# Patient Record
Sex: Female | Born: 1975 | Race: White | Hispanic: No | Marital: Married | Smoking: Never smoker
Health system: Southern US, Community
[De-identification: ages and names within clinical notes are randomized; demographics above are authoritative.]

## PROBLEM LIST (undated history)

## (undated) DIAGNOSIS — Z8619 Personal history of other infectious and parasitic diseases: Secondary | ICD-10-CM

## (undated) HISTORY — DX: Personal history of other infectious and parasitic diseases: Z86.19

## (undated) HISTORY — PX: WISDOM TOOTH EXTRACTION: SHX21

---

## 2008-12-26 ENCOUNTER — Inpatient Hospital Stay (HOSPITAL_COMMUNITY): Admission: AD | Admit: 2008-12-26 | Discharge: 2008-12-29 | Payer: Self-pay | Admitting: Obstetrics and Gynecology

## 2009-02-07 ENCOUNTER — Ambulatory Visit: Admission: RE | Admit: 2009-02-07 | Discharge: 2009-02-07 | Payer: Self-pay | Admitting: Obstetrics and Gynecology

## 2010-10-28 LAB — CBC
HCT: 33.9 % — ABNORMAL LOW (ref 36.0–46.0)
Hemoglobin: 11.9 g/dL — ABNORMAL LOW (ref 12.0–15.0)
MCHC: 35.2 g/dL (ref 30.0–36.0)
MCHC: 35.8 g/dL (ref 30.0–36.0)
MCV: 100.2 fL — ABNORMAL HIGH (ref 78.0–100.0)
Platelets: 121 10*3/uL — ABNORMAL LOW (ref 150–400)
RBC: 2.76 MIL/uL — ABNORMAL LOW (ref 3.87–5.11)
RDW: 13.7 % (ref 11.5–15.5)

## 2011-02-03 LAB — HEPATITIS B SURFACE ANTIGEN: Hepatitis B Surface Ag: NEGATIVE

## 2011-02-03 LAB — RUBELLA ANTIBODY, IGM: Rubella: IMMUNE

## 2011-02-03 LAB — GC/CHLAMYDIA PROBE AMP, GENITAL: Chlamydia: NEGATIVE

## 2011-07-22 NOTE — L&D Delivery Note (Signed)
Delivery Note At 1:01 AM a viable female was delivered via Vaginal, Spontaneous Delivery (Presentation: ; Occiput Anterior).  APGAR: 9, 9; weight 7 lb 4 oz (3289 g).   Placenta status: Intact, Spontaneous.  Cord: 3 vessels with the following complications: None.  Cord pH: not sent  Anesthesia: Local  Episiotomy: None Lacerations: 2nd degree Suture Repair: 3.0 chromic vicryl rapide Est. Blood Loss (mL): 400 Rapid del by midwife, plac spont intact, received IV Stadol 1 mg and local>>bilat vag lac + second degree perineal lac repaired   Mom to postpartum.  Baby to nursery-stable.  Meriel Pica 09/12/2011, 1:49 AM

## 2011-08-11 LAB — STREP B DNA PROBE: GBS: NEGATIVE

## 2011-09-01 ENCOUNTER — Telehealth (HOSPITAL_COMMUNITY): Payer: Self-pay | Admitting: *Deleted

## 2011-09-01 ENCOUNTER — Encounter (HOSPITAL_COMMUNITY): Payer: Self-pay | Admitting: *Deleted

## 2011-09-01 NOTE — Telephone Encounter (Signed)
Preadmission screen  

## 2011-09-03 ENCOUNTER — Encounter (HOSPITAL_COMMUNITY): Payer: Self-pay | Admitting: *Deleted

## 2011-09-03 ENCOUNTER — Telehealth (HOSPITAL_COMMUNITY): Payer: Self-pay | Admitting: *Deleted

## 2011-09-03 NOTE — Telephone Encounter (Signed)
Preadmission screen  

## 2011-09-11 ENCOUNTER — Inpatient Hospital Stay (HOSPITAL_COMMUNITY)
Admission: AD | Admit: 2011-09-11 | Discharge: 2011-09-14 | DRG: 373 | Disposition: A | Discharge status: Home / Self Care | Payer: BC Managed Care – PPO | Source: Ambulatory Visit | Attending: Obstetrics and Gynecology | Admitting: Obstetrics and Gynecology

## 2011-09-12 ENCOUNTER — Encounter (HOSPITAL_COMMUNITY): Payer: Self-pay | Admitting: *Deleted

## 2011-09-12 LAB — CBC
HCT: 34.4 % — ABNORMAL LOW (ref 36.0–46.0)
HCT: 31.7 % — ABNORMAL LOW (ref 36.0–46.0)
Hemoglobin: 11.9 g/dL — ABNORMAL LOW (ref 12.0–15.0)
Hemoglobin: 10.8 g/dL — ABNORMAL LOW (ref 12.0–15.0)
MCH: 32.4 pg (ref 26.0–34.0)
MCH: 32.1 pg (ref 26.0–34.0)
MCHC: 34.1 g/dL (ref 30.0–36.0)
MCV: 93.7 fL (ref 78.0–100.0)
Platelets: 171 10*3/uL (ref 150–400)
RBC: 3.67 MIL/uL — ABNORMAL LOW (ref 3.87–5.11)
WBC: 10.4 10*3/uL (ref 4.0–10.5)

## 2011-09-12 MED ORDER — OXYCODONE-ACETAMINOPHEN 5-325 MG PO TABS
1.0000 | ORAL_TABLET | Freq: Four times a day (QID) | ORAL | Status: DC | PRN
  Administered 2011-09-12 – 2011-09-13 (×2): 1 via ORAL
  Filled 2011-09-12 (×2): qty 1

## 2011-09-12 MED ORDER — SENNOSIDES-DOCUSATE SODIUM 8.6-50 MG PO TABS
2.0000 | ORAL_TABLET | Freq: Every day | ORAL | Status: DC
  Administered 2011-09-12 – 2011-09-13 (×3): 2 via ORAL

## 2011-09-12 MED ORDER — LANOLIN HYDROUS EX OINT: TOPICAL_OINTMENT | CUTANEOUS | Status: DC | PRN

## 2011-09-12 MED ORDER — OXYTOCIN 20 UNITS IN LACTATED RINGERS INFUSION - SIMPLE
INTRAVENOUS | Status: AC
  Administered 2011-09-12: 125 mL/h via INTRAVENOUS
  Filled 2011-09-12: qty 1000

## 2011-09-12 MED ORDER — FLEET ENEMA 7-19 GM/118ML RE ENEM: 1.0000 | ENEMA | Freq: Every day | RECTAL | Status: DC | PRN

## 2011-09-12 MED ORDER — BUTORPHANOL TARTRATE 2 MG/ML IJ SOLN
1.0000 mg | Freq: Once | INTRAMUSCULAR | Status: AC
  Administered 2011-09-12: 1 mg via INTRAVENOUS
  Filled 2011-09-12 (×2): qty 1

## 2011-09-12 MED ORDER — LIDOCAINE HCL (PF) 1 % IJ SOLN
INTRAMUSCULAR | Status: AC
  Administered 2011-09-12: 30 mL via SUBCUTANEOUS
  Filled 2011-09-12: qty 30

## 2011-09-12 MED ORDER — IBUPROFEN 800 MG PO TABS
800.0000 mg | ORAL_TABLET | Freq: Three times a day (TID) | ORAL | Status: DC | PRN
  Administered 2011-09-12 – 2011-09-14 (×6): 800 mg via ORAL
  Filled 2011-09-12 (×8): qty 1

## 2011-09-12 MED ORDER — OXYTOCIN BOLUS FROM INFUSION
500.0000 mL | Freq: Once | INTRAVENOUS | Status: DC
  Filled 2011-09-12: qty 500

## 2011-09-12 MED ORDER — IBUPROFEN 600 MG PO TABS
600.0000 mg | ORAL_TABLET | Freq: Four times a day (QID) | ORAL | Status: DC | PRN
  Administered 2011-09-12: 600 mg via ORAL
  Filled 2011-09-12: qty 1

## 2011-09-12 MED ORDER — LACTATED RINGERS IV SOLN: INTRAVENOUS | Status: DC

## 2011-09-12 MED ORDER — WITCH HAZEL-GLYCERIN EX PADS: 1.0000 "application " | MEDICATED_PAD | CUTANEOUS | Status: DC | PRN

## 2011-09-12 MED ORDER — BENZOCAINE-MENTHOL 20-0.5 % EX AERO
INHALATION_SPRAY | CUTANEOUS | Status: AC
  Filled 2011-09-12: qty 56

## 2011-09-12 MED ORDER — BISACODYL 10 MG RE SUPP: 10.0000 mg | Freq: Every day | RECTAL | Status: DC | PRN

## 2011-09-12 MED ORDER — BENZOCAINE-MENTHOL 20-0.5 % EX AERO
1.0000 "application " | INHALATION_SPRAY | CUTANEOUS | Status: DC | PRN
  Administered 2011-09-12: 1 via TOPICAL

## 2011-09-12 MED ORDER — CITRIC ACID-SODIUM CITRATE 334-500 MG/5ML PO SOLN: 30.0000 mL | ORAL | Status: DC | PRN

## 2011-09-12 MED ORDER — OXYTOCIN 20 UNITS IN LACTATED RINGERS INFUSION - SIMPLE
125.0000 mL/h | Freq: Once | INTRAVENOUS | Status: AC
  Administered 2011-09-12: 125 mL/h via INTRAVENOUS

## 2011-09-12 MED ORDER — ONDANSETRON HCL 4 MG/2ML IJ SOLN: 4.0000 mg | INTRAMUSCULAR | Status: DC | PRN

## 2011-09-12 MED ORDER — PRENATAL MULTIVITAMIN CH
1.0000 | ORAL_TABLET | Freq: Every day | ORAL | Status: DC
  Administered 2011-09-12 – 2011-09-13 (×2): 1 via ORAL
  Filled 2011-09-12 (×3): qty 1

## 2011-09-12 MED ORDER — FLEET ENEMA 7-19 GM/118ML RE ENEM: 1.0000 | ENEMA | RECTAL | Status: DC | PRN

## 2011-09-12 MED ORDER — ZOLPIDEM TARTRATE 5 MG PO TABS: 5.0000 mg | ORAL_TABLET | Freq: Every evening | ORAL | Status: DC | PRN

## 2011-09-12 MED ORDER — TETANUS-DIPHTH-ACELL PERTUSSIS 5-2.5-18.5 LF-MCG/0.5 IM SUSP: 0.5000 mL | Freq: Once | INTRAMUSCULAR | Status: DC

## 2011-09-12 MED ORDER — OXYCODONE-ACETAMINOPHEN 5-325 MG PO TABS
1.0000 | ORAL_TABLET | ORAL | Status: DC | PRN
  Administered 2011-09-12: 1 via ORAL
  Filled 2011-09-12: qty 1

## 2011-09-12 MED ORDER — MEASLES, MUMPS & RUBELLA VAC ~~LOC~~ INJ
0.5000 mL | INJECTION | Freq: Once | SUBCUTANEOUS | Status: DC
  Filled 2011-09-12: qty 0.5

## 2011-09-12 MED ORDER — DIPHENHYDRAMINE HCL 25 MG PO CAPS: 25.0000 mg | ORAL_CAPSULE | Freq: Four times a day (QID) | ORAL | Status: DC | PRN

## 2011-09-12 MED ORDER — LIDOCAINE HCL (PF) 1 % IJ SOLN
30.0000 mL | INTRAMUSCULAR | Status: AC | PRN
  Administered 2011-09-12 (×2): 30 mL via SUBCUTANEOUS
  Filled 2011-09-12: qty 30

## 2011-09-12 MED ORDER — DIBUCAINE 1 % RE OINT: 1.0000 "application " | TOPICAL_OINTMENT | RECTAL | Status: DC | PRN

## 2011-09-12 MED ORDER — LACTATED RINGERS IV SOLN: 500.0000 mL | INTRAVENOUS | Status: DC | PRN

## 2011-09-12 MED ORDER — ACETAMINOPHEN 325 MG PO TABS: 650.0000 mg | ORAL_TABLET | ORAL | Status: DC | PRN

## 2011-09-12 MED ORDER — ONDANSETRON HCL 4 MG PO TABS: 4.0000 mg | ORAL_TABLET | ORAL | Status: DC | PRN

## 2011-09-12 MED ORDER — ONDANSETRON HCL 4 MG/2ML IJ SOLN: 4.0000 mg | Freq: Four times a day (QID) | INTRAMUSCULAR | Status: DC | PRN

## 2011-09-12 MED ORDER — SIMETHICONE 80 MG PO CHEW: 80.0000 mg | CHEWABLE_TABLET | ORAL | Status: DC | PRN

## 2011-09-12 NOTE — Progress Notes (Signed)
Post Partum Day 0 Subjective: no complaints, up ad lib and tolerating PO  Objective: Blood pressure 97/59, pulse 81, temperature 98.2 F (36.8 C), temperature source Oral, resp. rate 18, height 5\' 4"  (1.626 m), weight 76.204 kg (168 lb), last menstrual period 12/04/2010, SpO2 99.00%, unknown if currently breastfeeding.  Physical Exam:  General: alert and cooperative Lochia: appropriate Uterine Fundus: firm Perineum intact DVT Evaluation: No evidence of DVT seen on physical exam.   Basename 09/12/11 0505 09/12/11 0150  HGB 10.8* 11.9*  HCT 31.7* 34.4*    Assessment/Plan: Plan for discharge tomorrow   LOS: 1 day   Keonda Dow G 09/12/2011, 7:53 AM

## 2011-09-12 NOTE — H&P (Signed)
Shannon Harmon is a 36 y.o. female presenting for labor at term. Maternal Medical History:  Reason for admission: Reason for admission: contractions.  Contractions: Onset was 3-5 hours ago.   Frequency: regular.   Perceived severity is moderate.    Fetal activity: Perceived fetal activity is normal.   Last perceived fetal movement was within the past hour.      OB History    Grav Para Term Preterm Abortions TAB SAB Ect Mult Living   2 1 1       1      Past Medical History  Diagnosis Date  . History of chicken pox    Past Surgical History  Procedure Date  . Wisdom tooth extraction    Family History: family history includes Diabetes in her father, mother, and paternal grandfather; Heart disease in her maternal grandmother; Hypertension in her father and mother; and Stroke in her maternal grandfather. Social History:  reports that she has never smoked. She has never used smokeless tobacco. She reports that she does not drink alcohol or use illicit drugs.  ROS  Dilation: 7 Effacement (%): 100 Station: -1 Exam by:: M.Topp,RN Blood pressure 114/76, pulse 88, temperature 98.6 F (37 C), temperature source Oral, resp. rate 20, height 5\' 4"  (1.626 m), weight 168 lb (76.204 kg), last menstrual period 12/04/2010. Maternal Exam:  Uterine Assessment: Contraction strength is moderate.  Contraction frequency is regular.   Abdomen: Patient reports no abdominal tenderness. Fundal height is term.   Estimated fetal weight is AGA.   Fetal presentation: vertex     Physical Exam  Constitutional: She is oriented to person, place, and time. She appears well-developed and well-nourished.  HENT:  Head: Normocephalic and atraumatic.  Neck: Normal range of motion. Neck supple.  Cardiovascular: Normal rate and regular rhythm.   Respiratory: Effort normal and breath sounds normal.  GI:       Term FH, FHR 150's  Genitourinary:       In MAU>>cx 5 cm  Musculoskeletal: Normal range of motion.    Neurological: She is alert and oriented to person, place, and time.    Prenatal labs: ABO, Rh: A/Positive/-- (07/16 0000) Antibody: Negative (07/16 0000) Rubella: Immune (07/16 0000) RPR: Nonreactive (07/16 0000)  HBsAg: Negative (07/16 0000)  HIV: Non-reactive (07/16 0000)  GBS: Negative (01/21 0000)   Assessment/Plan: Term IUP, labor, requests epidural   Shannon Harmon M 09/12/2011, 1:47 AM

## 2011-09-12 NOTE — Progress Notes (Signed)
C.Hayes,RN called and status update given on the pt-informed pt was 7cm and getting "pushy" and requesting an epidural-sttes she will call us back

## 2011-09-13 NOTE — Progress Notes (Signed)
Post Partum Day 1 Subjective: no complaints, up ad lib, voiding and tolerating PO  Objective: Blood pressure 105/73, pulse 81, temperature 98.3 F (36.8 C), temperature source Oral, resp. rate 18, height 5\' 4"  (1.626 m), weight 76.204 kg (168 lb), last menstrual period 12/04/2010, SpO2 100.00%, unknown if currently breastfeeding.  Physical Exam:  General: alert, cooperative and appears stated age Lochia: appropriate Uterine Fundus: firm Incision: not applicable DVT Evaluation: No evidence of DVT seen on physical exam.   Basename 09/12/11 0505 09/12/11 0150  HGB 10.8* 11.9*  HCT 31.7* 34.4*    Assessment/Plan: Plan for discharge tomorrow   LOS: 2 days   Brittlyn Cloe L 09/13/2011, 7:50 AM

## 2011-09-14 ENCOUNTER — Inpatient Hospital Stay (HOSPITAL_COMMUNITY): Admission: RE | Admit: 2011-09-14 | Payer: BC Managed Care – PPO | Source: Ambulatory Visit

## 2011-09-14 NOTE — Discharge Summary (Signed)
Obstetric Discharge Summary Reason for Admission: onset of labor Prenatal Procedures: none Intrapartum Procedures: spontaneous vaginal delivery Postpartum Procedures: none Complications-Operative and Postpartum: 2nd degree perineal laceration Hemoglobin  Date Value Range Status  09/12/2011 10.8* 12.0-15.0 (g/dL) Final     HCT  Date Value Range Status  09/12/2011 31.7* 36.0-46.0 (%) Final    Discharge Diagnoses: Term Pregnancy-delivered  Discharge Information: Date: 09/14/2011 Activity: pelvic rest Diet: routine Medications: None Condition: stable Instructions: refer to practice specific booklet Discharge to: home   Newborn Data: Live born female  Birth Weight: 7 lb 4 oz (3289 g) APGAR: 9, 9  Home with mother.  Sayre Mazor L 09/14/2011, 7:40 AM

## 2011-09-14 NOTE — Progress Notes (Signed)
Post Partum Day 2  Subjective: no complaints, up ad lib, voiding, tolerating PO and + flatus  Objective: Blood pressure 94/63, pulse 74, temperature 98.1 F (36.7 C), temperature source Oral, resp. rate 20, height 5\' 4"  (1.626 m), weight 76.204 kg (168 lb), last menstrual period 12/04/2010, SpO2 100.00%, unknown if currently breastfeeding.  Physical Exam:  General: alert, cooperative and appears stated age Lochia: appropriate Uterine Fundus: firm Incision: healing well DVT Evaluation: No evidence of DVT seen on physical exam.   Basename 09/12/11 0505 09/12/11 0150  HGB 10.8* 11.9*  HCT 31.7* 34.4*    Assessment/Plan: Discharge home and Breastfeeding   LOS: 3 days   Taysha Majewski L 09/14/2011, 7:39 AM

## 2014-05-22 ENCOUNTER — Encounter (HOSPITAL_COMMUNITY): Payer: Self-pay | Admitting: *Deleted

## 2015-11-27 ENCOUNTER — Other Ambulatory Visit: Payer: Self-pay | Admitting: Obstetrics and Gynecology

## 2015-11-27 DIAGNOSIS — R928 Other abnormal and inconclusive findings on diagnostic imaging of breast: Secondary | ICD-10-CM

## 2015-11-28 ENCOUNTER — Other Ambulatory Visit: Payer: Self-pay | Admitting: Family

## 2015-12-06 ENCOUNTER — Other Ambulatory Visit: Payer: BC Managed Care – PPO

## 2015-12-11 ENCOUNTER — Ambulatory Visit
Admission: RE | Admit: 2015-12-11 | Discharge: 2015-12-11 | Disposition: A | Discharge status: Home / Self Care | Payer: BC Managed Care – PPO | Source: Ambulatory Visit | Attending: Obstetrics and Gynecology | Admitting: Obstetrics and Gynecology

## 2015-12-11 DIAGNOSIS — R928 Other abnormal and inconclusive findings on diagnostic imaging of breast: Secondary | ICD-10-CM

## 2019-09-19 ENCOUNTER — Other Ambulatory Visit: Payer: BC Managed Care – PPO

## 2019-09-19 ENCOUNTER — Ambulatory Visit: Payer: BC Managed Care – PPO | Attending: Internal Medicine

## 2019-09-19 DIAGNOSIS — Z20822 Contact with and (suspected) exposure to covid-19: Secondary | ICD-10-CM

## 2019-09-21 LAB — NOVEL CORONAVIRUS, NAA: SARS-CoV-2, NAA: NOT DETECTED

## 2021-04-08 ENCOUNTER — Other Ambulatory Visit: Payer: Self-pay | Admitting: Obstetrics and Gynecology

## 2021-04-08 DIAGNOSIS — R928 Other abnormal and inconclusive findings on diagnostic imaging of breast: Secondary | ICD-10-CM

## 2021-04-26 ENCOUNTER — Ambulatory Visit: Payer: BC Managed Care – PPO

## 2021-04-26 ENCOUNTER — Other Ambulatory Visit: Payer: Self-pay

## 2021-04-26 ENCOUNTER — Ambulatory Visit
Admission: RE | Admit: 2021-04-26 | Discharge: 2021-04-26 | Disposition: A | Discharge status: Home / Self Care | Payer: BC Managed Care – PPO | Source: Ambulatory Visit | Attending: Obstetrics and Gynecology | Admitting: Obstetrics and Gynecology

## 2021-04-26 DIAGNOSIS — R928 Other abnormal and inconclusive findings on diagnostic imaging of breast: Secondary | ICD-10-CM

## 2022-04-28 IMAGING — MG MM DIGITAL DIAGNOSTIC UNILAT*L* W/ TOMO W/ CAD
4 series · 4 of 12 positions shown · non-contrast
Comparison: Previous exam(s).

CLINICAL DATA: Screening recall for a possible left breast
asymmetry.

EXAM:
DIGITAL DIAGNOSTIC UNILATERAL LEFT MAMMOGRAM WITH TOMOSYNTHESIS AND
CAD
TECHNIQUE: Left digital diagnostic mammography and breast tomosynthesis was
performed. The images were evaluated with computer-aided detection.

[L ML synth-2D]
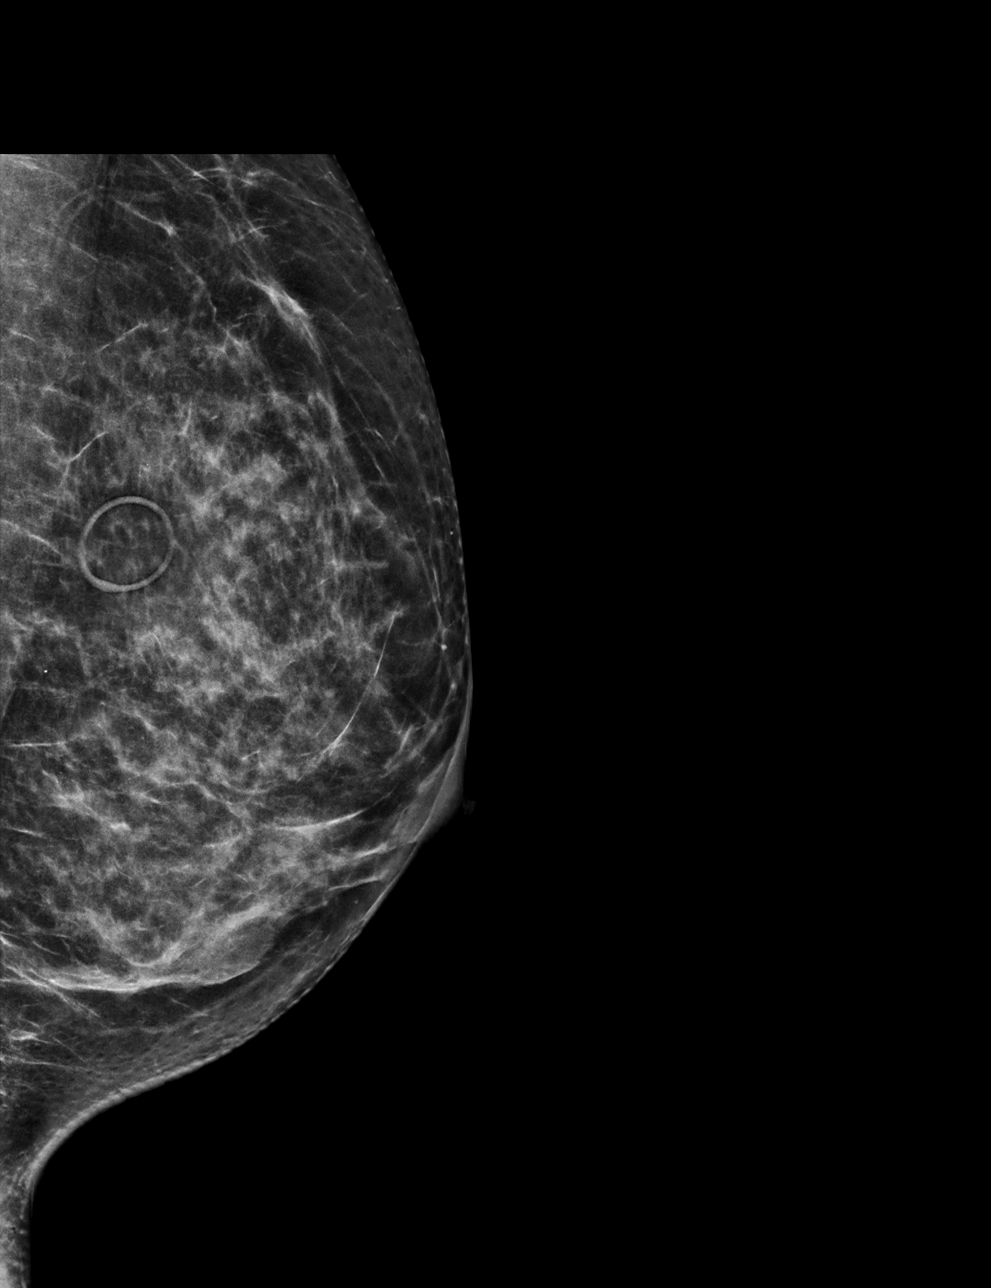

[L MLO synth-2D]
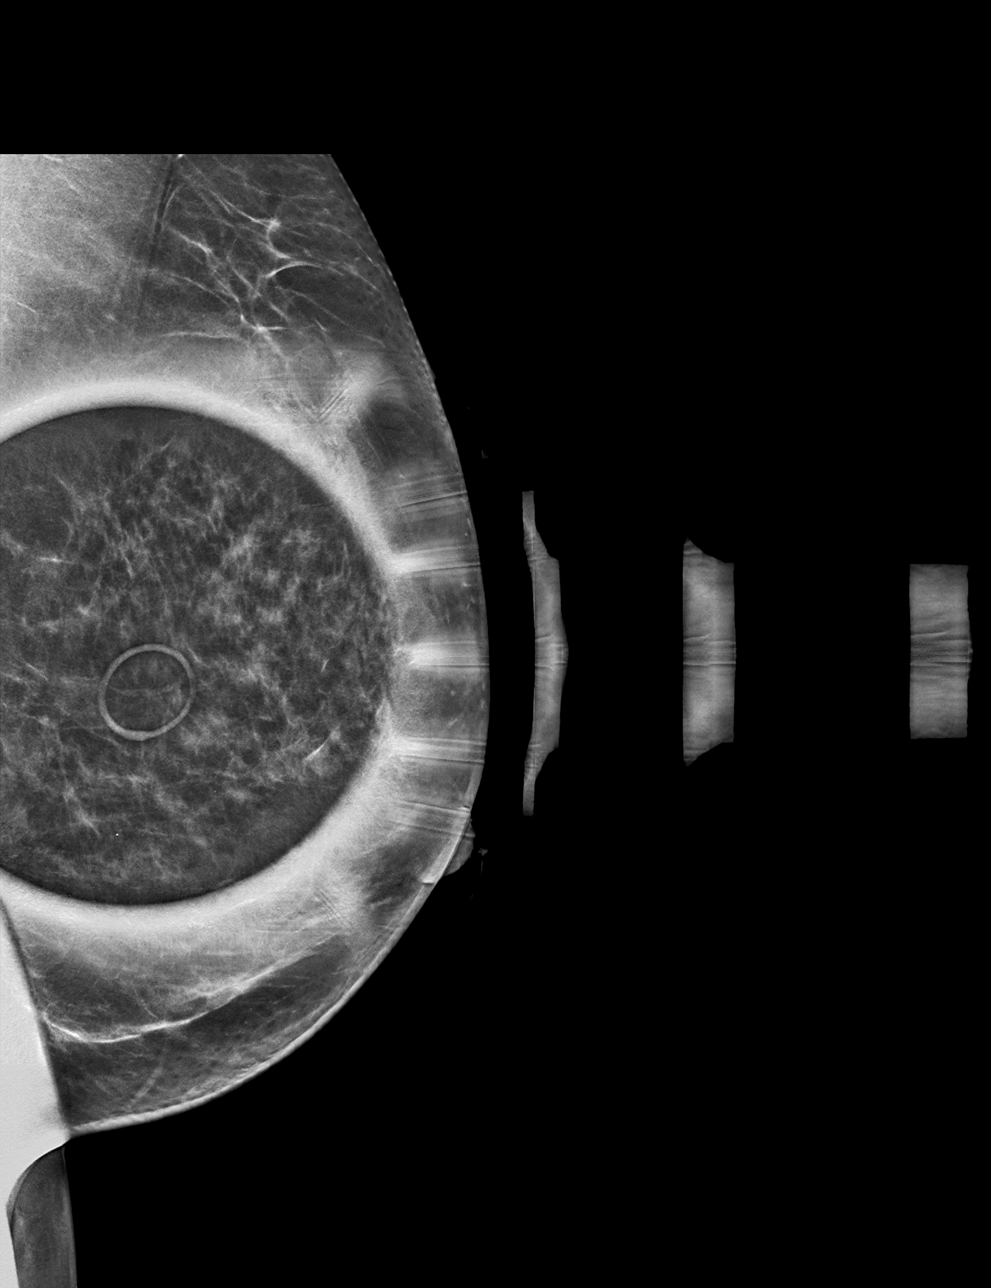

[L MLO tomo · tomo slice 27/52.0]
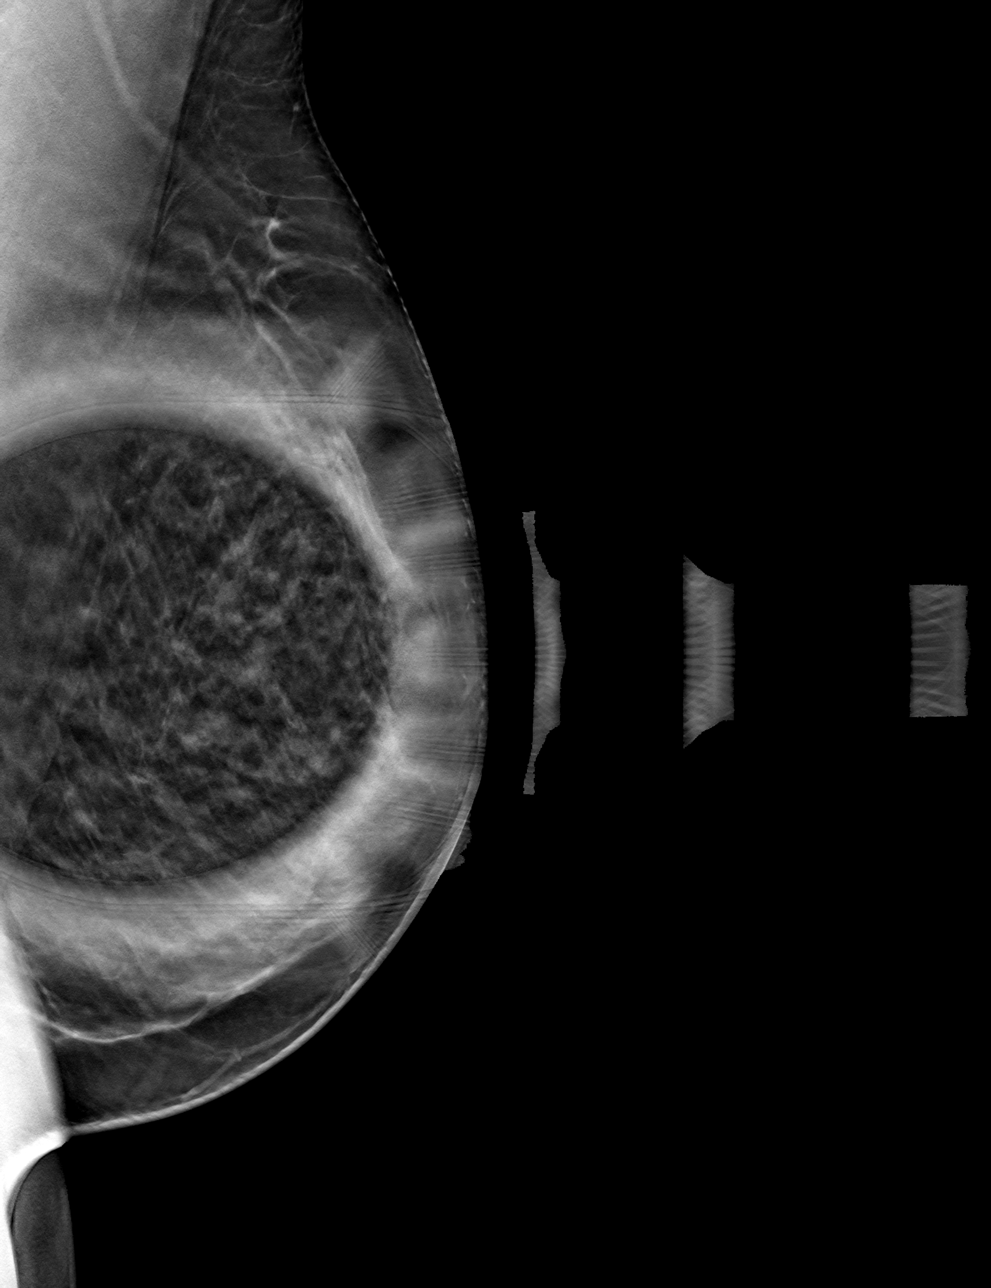

[L ML tomo · tomo slice 30/59.0]
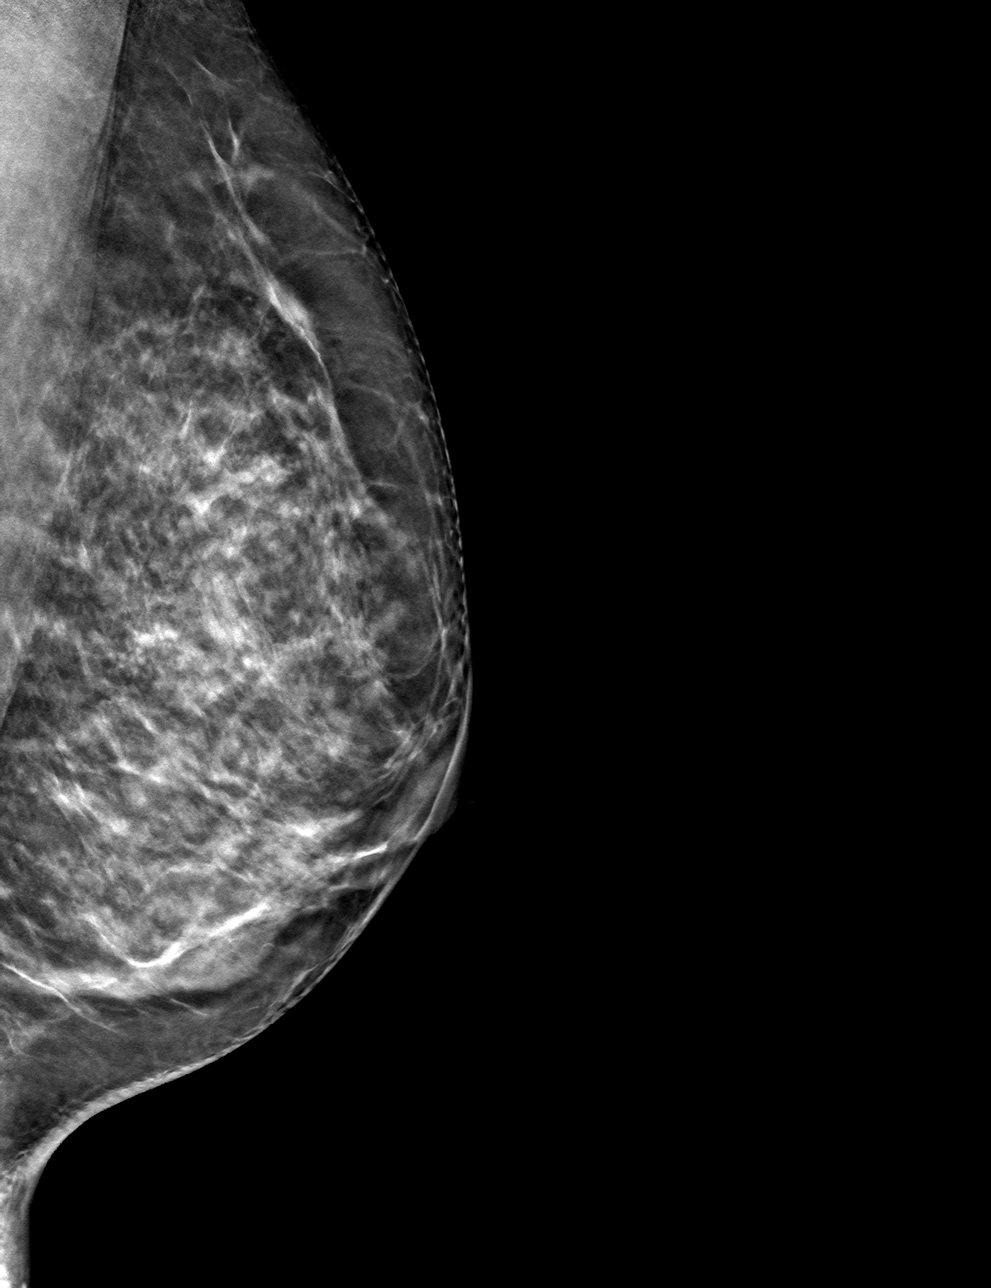

[4 of 12 positions shown; findings below may reference images not displayed]

ACR Breast Density Category c: The breast tissue is heterogeneously
dense, which may obscure small masses.
FINDINGS: The possible asymmetry, which was noted on the screening left breast
MLO view, disperses on spot compression imaging consistent with
superimposed fibroglandular tissue. There is no underlying mass.
There is no significant residual asymmetry. There are no areas of
architectural distortion and no suspicious calcifications.
IMPRESSION: No evidence of breast malignancy.

RECOMMENDATION:
Screening mammogram in one year.(Code:[id number]-[id number])

I have discussed the findings and recommendations with the patient.
If applicable, a reminder letter will be sent to the patient
regarding the next appointment.

BI-RADS CATEGORY  1: Negative.

## 2022-07-25 ENCOUNTER — Telehealth: Payer: Self-pay | Admitting: Internal Medicine

## 2022-07-25 NOTE — Telephone Encounter (Signed)
Hi Dr. Hilarie Fredrickson,   We received a referral for patient to have another colonoscopy. She has GI history with [facility] and said she was highly referred to you by her GYN provider. Also said she thinks you might have coached her son? Not sure, her records from [place] were obtained for you to review had colonoscopy in 2016.  Please advise on scheduling.      Thank you

## 2022-07-30 NOTE — Telephone Encounter (Signed)
Called patient to schedule left voicemail. 

## 2022-07-31 ENCOUNTER — Encounter: Payer: Self-pay | Admitting: Internal Medicine

## 2022-09-16 ENCOUNTER — Ambulatory Visit (AMBULATORY_SURGERY_CENTER): Payer: BC Managed Care – PPO | Admitting: *Deleted

## 2022-09-16 ENCOUNTER — Encounter: Payer: Self-pay | Admitting: Internal Medicine

## 2022-09-16 VITALS — Ht 64.5 in | Wt 135.0 lb

## 2022-09-16 DIAGNOSIS — Z800 Family history of malignant neoplasm of digestive organs: Secondary | ICD-10-CM

## 2022-09-16 DIAGNOSIS — Z1211 Encounter for screening for malignant neoplasm of colon: Secondary | ICD-10-CM

## 2022-09-16 MED ORDER — NA SULFATE-K SULFATE-MG SULF 17.5-3.13-1.6 GM/177ML PO SOLN: 1.0000 | Freq: Once | ORAL | 0 refills | Status: AC

## 2022-09-16 NOTE — Progress Notes (Signed)
No egg or soy allergy known to patient  No issues known to pt with past sedation with any surgeries or procedures Patient denies ever being intubated No issues moving head or neck No issues swallowing  No FH of Malignant Hyperthermia Pt is not on diet pills Pt is not on  home 02  Pt is not on blood thinners  Pt denies issues with constipation  Pt is not on dialysis Pt denies any upcoming cardiac testing Pt encouraged to use to use Singlecare or Goodrx to reduce cost  Visit by phone Instructions reviewed with pt and pt states understanding. Instructed to review again prior to procedure. Pt states they will.  Instructions have been sent by mail with coupon and by my chart

## 2022-09-29 ENCOUNTER — Encounter: Payer: Self-pay | Admitting: Internal Medicine

## 2022-09-29 ENCOUNTER — Ambulatory Visit (AMBULATORY_SURGERY_CENTER): Payer: BC Managed Care – PPO | Admitting: Internal Medicine

## 2022-09-29 VITALS — BP 103/65 | HR 61 | Temp 98.2°F | Resp 9 | Ht 64.5 in | Wt 135.0 lb

## 2022-09-29 DIAGNOSIS — D123 Benign neoplasm of transverse colon: Secondary | ICD-10-CM | POA: Diagnosis not present

## 2022-09-29 DIAGNOSIS — K635 Polyp of colon: Secondary | ICD-10-CM

## 2022-09-29 DIAGNOSIS — D128 Benign neoplasm of rectum: Secondary | ICD-10-CM

## 2022-09-29 DIAGNOSIS — D124 Benign neoplasm of descending colon: Secondary | ICD-10-CM | POA: Diagnosis not present

## 2022-09-29 DIAGNOSIS — K621 Rectal polyp: Secondary | ICD-10-CM | POA: Diagnosis not present

## 2022-09-29 DIAGNOSIS — Z1211 Encounter for screening for malignant neoplasm of colon: Secondary | ICD-10-CM | POA: Diagnosis not present

## 2022-09-29 DIAGNOSIS — Z83719 Family history of colon polyps, unspecified: Secondary | ICD-10-CM

## 2022-09-29 DIAGNOSIS — D122 Benign neoplasm of ascending colon: Secondary | ICD-10-CM

## 2022-09-29 DIAGNOSIS — D125 Benign neoplasm of sigmoid colon: Secondary | ICD-10-CM

## 2022-09-29 MED ORDER — SODIUM CHLORIDE 0.9 % IV SOLN: 500.0000 mL | INTRAVENOUS | Status: DC

## 2022-09-29 NOTE — Progress Notes (Signed)
Pt's states no medical or surgical changes since previsit or office visit. 

## 2022-09-29 NOTE — Progress Notes (Signed)
GASTROENTEROLOGY PROCEDURE H&P NOTE   Primary Care Physician: Harlan Stains, MD    Reason for Procedure:  Colon cancer screening  Plan:    Colonoscopy  Patient is appropriate for endoscopic procedure(s) in the ambulatory ([facility]) setting.  The nature of the procedure, as well as the risks, benefits, and alternatives were carefully and thoroughly reviewed with the patient. Ample time for discussion and questions allowed. The patient understood, was satisfied, and agreed to proceed.     HPI: Shannon Harmon is a 47 y.o. female who presents for colonoscopy.  Medical history as below.  Tolerated the prep.  No recent chest pain or shortness of breath.  No abdominal pain today.  Past Medical History:  Diagnosis Date   History of chicken pox     Past Surgical History:  Procedure Laterality Date   WISDOM TOOTH EXTRACTION      Prior to Admission medications   Medication Sig Start Date End Date Taking? Authorizing Provider  cetirizine (ZYRTEC) 10 MG tablet Take 10 mg by mouth every 12 (twelve) hours as needed for allergies.   Yes [provider]  Multiple Vitamin (MULTIVITAMIN) capsule Take 1 capsule by mouth daily.   Yes [provider]  tretinoin (RETIN-A) 0.05 % cream SMARTSIG:Sparingly Topical Every Night 05/19/22  Yes [provider]  levonorgestrel (MIRENA, 52 MG,) 20 MCG/DAY IUD Provided by care center    [provider]  Prenatal Vit-Fe Fumarate-FA (PRENATAL MULTIVITAMIN) TABS Take 1 tablet by mouth daily. Patient not taking: Reported on 09/16/2022    [provider]    Current Outpatient Medications  Medication Sig Dispense Refill   cetirizine (ZYRTEC) 10 MG tablet Take 10 mg by mouth every 12 (twelve) hours as needed for allergies.     Multiple Vitamin (MULTIVITAMIN) capsule Take 1 capsule by mouth daily.     tretinoin (RETIN-A) 0.05 % cream SMARTSIG:Sparingly Topical Every Night     levonorgestrel (MIRENA, 52 MG,) 20 MCG/DAY IUD  Provided by care center     Prenatal Vit-Fe Fumarate-FA (PRENATAL MULTIVITAMIN) TABS Take 1 tablet by mouth daily. (Patient not taking: Reported on 09/16/2022)     Current Facility-Administered Medications  Medication Dose Route Frequency Provider Last Rate Last Admin   0.9 %  sodium chloride infusion  500 mL Intravenous Continuous Dickey Caamano, Lajuan Lines, MD        Allergies as of 09/29/2022 - Review Complete 09/29/2022  Allergen Reaction Noted   Octinoxate (octyl methoxycinnamate) Dermatitis 09/29/2022    Family History  Problem Relation Age of Onset   Colon polyps Mother    Hypertension Mother    Diabetes Mother    Hypertension Father    Diabetes Father    Colon cancer Maternal Grandmother    Heart disease Maternal Grandmother    Stroke Maternal Grandfather    Diabetes Paternal Grandfather    Esophageal cancer Neg Hx    Rectal cancer Neg Hx    Stomach cancer Neg Hx     Social History   Socioeconomic History   Marital status: Married    Spouse name: Not on file   Number of children: Not on file   Years of education: Not on file   Highest education level: Not on file  Occupational History   Not on file  Tobacco Use   Smoking status: Never   Smokeless tobacco: Never  Substance and Sexual Activity   Alcohol use: No   Drug use: No   Sexual activity: Yes    Birth control/protection:  I.U.D.  Other Topics Concern   Not on file  Social History Narrative   Not on file   Social Determinants of Health   Financial Resource Strain: Not on file  Food Insecurity: Not on file  Transportation Needs: Not on file  Physical Activity: Not on file  Stress: Not on file  Social Connections: Not on file  Intimate Partner Violence: Not on file    Physical Exam: Vital signs in last 24 hours: '@BP'$  97/60   Pulse 85   Temp 98.2 F (36.8 C)   Resp 10   Ht 5' 4.5" (1.638 m)   Wt 135 lb (61.2 kg)   SpO2 100%   BMI 22.81 kg/m  GEN: NAD EYE: Sclerae anicteric ENT: MMM CV:  Non-tachycardic Pulm: CTA b/l GI: Soft, NT/ND NEURO:  Alert & Oriented x 3   Zenovia Jarred, MD [facility]  09/29/2022 12:15 PM

## 2022-09-29 NOTE — Patient Instructions (Addendum)
- Repeat colonoscopy is recommended for                            surveillance. The colonoscopy date will be                            determined after pathology results from today's                            exam become available for review.  Resume previous medications.  Await results for final recommendations.  Handouts on findings given to patient.  (Polyps)  YOU HAD AN ENDOSCOPIC PROCEDURE TODAY AT [facility]:   Refer to the procedure report that was given to you for any specific questions about what was found during the examination.  If the procedure report does not answer your questions, please call your gastroenterologist to clarify.  If you requested that your care partner not be given the details of your procedure findings, then the procedure report has been included in a sealed envelope for you to review at your convenience later.  YOU SHOULD EXPECT: Some feelings of bloating in the abdomen. Passage of more gas than usual.  Walking can help get rid of the air that was put into your GI tract during the procedure and reduce the bloating. If you had a lower endoscopy (such as a colonoscopy or flexible sigmoidoscopy) you may notice spotting of blood in your stool or on the toilet paper. If you underwent a bowel prep for your procedure, you may not have a normal bowel movement for a few days.  Please Note:  You might notice some irritation and congestion in your nose or some drainage.  This is from the oxygen used during your procedure.  There is no need for concern and it should clear up in a day or so.  SYMPTOMS TO REPORT IMMEDIATELY:  Following lower endoscopy (colonoscopy or flexible sigmoidoscopy):  Excessive amounts of blood in the stool  Significant tenderness or worsening of abdominal pains  Swelling of the abdomen that is new, acute  Fever of 100F or higher   For urgent or emergent issues, a gastroenterologist can be reached at  any hour by calling [phone]. Do not use MyChart messaging for urgent concerns.    DIET:  We do recommend a small meal at first, but then you may proceed to your regular diet.  Drink plenty of fluids but you should avoid alcoholic beverages for 24 hours.  ACTIVITY:  You should plan to take it easy for the rest of today and you should NOT DRIVE or use heavy machinery until tomorrow (because of the sedation medicines used during the test).    FOLLOW UP: Our staff will call the number listed on your records the next business day following your procedure.  We will call around 7:15- 8:00 am to check on you and address any questions or concerns that you may have regarding the information given to you following your procedure. If we do not reach you, we will leave a message.     If any biopsies were taken you will be contacted by phone or by letter within the next 1-3 weeks.  Please  call us at [phone] if you have not heard about the biopsies in 3 weeks.    SIGNATURES/CONFIDENTIALITY: You and/or your care partner have signed paperwork which will be entered into your electronic medical record.  These signatures attest to the fact that that the information above on your After Visit Summary has been reviewed and is understood.  Full responsibility of the confidentiality of this discharge information lies with you and/or your care-partner.

## 2022-09-29 NOTE — Progress Notes (Signed)
Called to room to assist during endoscopic procedure.  Patient ID and intended procedure confirmed with present staff. Received instructions for my participation in the procedure from the performing physician.  

## 2022-09-29 NOTE — Op Note (Signed)
[facility] Patient Name: Shannon Harmon Procedure Date: 09/29/2022 12:01 PM MRN: [id number] Endoscopist: Jerene Bears , MD, [id number] Age: 47 Referring MD:  Date of Birth: 1975-08-07 Gender: Female Account #: [id number] Procedure:                Colonoscopy Indications:              Colon cancer screening in patient at increased                            risk: Family history of colon polyps in multiple                            1st-degree relatives Medicines:                Monitored Anesthesia Care Procedure:                Pre-Anesthesia Assessment:                           - Prior to the procedure, a History and Physical                            was performed, and patient medications and                            allergies were reviewed. The patient's tolerance of                            previous anesthesia was also reviewed. The risks                            and benefits of the procedure and the sedation                            options and risks were discussed with the patient.                            All questions were answered, and informed consent                            was obtained. Prior Anticoagulants: The patient has                            taken no anticoagulant or antiplatelet agents. ASA                            Grade Assessment: I - A normal, healthy patient.                            After reviewing the risks and benefits, the patient                            was deemed in satisfactory condition to undergo the  procedure.                           After obtaining informed consent, the colonoscope                            was passed under direct vision. Throughout the                            procedure, the patient's blood pressure, pulse, and                            oxygen saturations were monitored continuously. The                            CF [id number] was introduced through the anus                             and advanced to the cecum, identified by                            appendiceal orifice and ileocecal valve. The                            colonoscopy was performed without difficulty. The                            patient tolerated the procedure well. The quality                            of the bowel preparation was good. The ileocecal                            valve, appendiceal orifice, and rectum were                            photographed. Scope In: 12:19:11 PM Scope Out: 12:43:48 PM Scope Withdrawal Time: 0 hours 18 minutes 24 seconds  Total Procedure Duration: 0 hours 24 minutes 37 seconds  Findings:                 The digital rectal exam was normal.                           Two sessile polyps were found in the ascending                            colon. The polyps were 3 to 6 mm in size. These                            polyps were removed with a cold snare. Resection                            and retrieval were complete.  A 5 mm polyp was found in the transverse colon. The                            polyp was sessile. The polyp was removed with a                            cold snare. Resection and retrieval were complete.                           A 4 mm polyp was found in the descending colon. The                            polyp was sessile. The polyp was removed with a                            cold snare. Resection and retrieval were complete.                           A 5 mm polyp was found in the sigmoid colon. The                            polyp was sessile. The polyp was removed with a                            cold snare. Resection and retrieval were complete.                           A 6 mm polyp was found in the proximal rectum. The                            polyp was sessile. The polyp was removed with a                            cold snare. Resection and retrieval were complete.                            Retroflexion in the rectum was not performed due                            narrow rectal vault, no abnormalities seen in the                            distal rectum/dentate line on direct views. Complications:            No immediate complications. Estimated Blood Loss:     Estimated blood loss was minimal. Impression:               - Two 3 to 6 mm polyps in the ascending colon,                            removed with a cold snare. Resected and retrieved.                           -  One 5 mm polyp in the transverse colon, removed                            with a cold snare. Resected and retrieved.                           - One 4 mm polyp in the descending colon, removed                            with a cold snare. Resected and retrieved.                           - One 5 mm polyp in the sigmoid colon, removed with                            a cold snare. Resected and retrieved.                           - One 6 mm polyp in the proximal rectum, removed                            with a cold snare. Resected and retrieved. Recommendation:           - Patient has a contact number available for                            emergencies. The signs and symptoms of potential                            delayed complications were discussed with the                            patient. Return to normal activities tomorrow.                            Written discharge instructions were provided to the                            patient.                           - Resume previous diet.                           - Continue present medications.                           - Await pathology results.                           - Repeat colonoscopy is recommended for                            surveillance. The colonoscopy date will be  determined after pathology results from today's                            exam become available for review. Jerene Bears, MD 09/29/2022 12:48:09  PM This report has been signed electronically.

## 2022-09-30 ENCOUNTER — Telehealth: Payer: Self-pay

## 2022-09-30 NOTE — Telephone Encounter (Signed)
  Follow up Call-     09/29/2022   11:07 AM  Call back number  Post procedure Call Back phone  # [phone]  Permission to leave phone message Yes     Patient questions:  Do you have a fever, pain , or abdominal swelling? No. Pain Score  0 *  Have you tolerated food without any problems? Yes.    Have you been able to return to your normal activities? Yes.    Do you have any questions about your discharge instructions: Diet   No. Medications  No. Follow up visit  No.  Do you have questions or concerns about your Care? No.  Actions: * If pain score is 4 or above: No action needed, pain <4.

## 2022-10-02 ENCOUNTER — Encounter: Payer: Self-pay | Admitting: Internal Medicine

## 2024-08-01 ENCOUNTER — Other Ambulatory Visit: Payer: Self-pay | Admitting: Obstetrics and Gynecology

## 2024-08-01 DIAGNOSIS — Z9189 Other specified personal risk factors, not elsewhere classified: Secondary | ICD-10-CM

## 2025-02-03 ENCOUNTER — Other Ambulatory Visit: Payer: Self-pay
